# Patient Record
Sex: Male | Born: 1995 | Hispanic: Yes | Marital: Single | Smoking: Current every day smoker
Health system: Southern US, Community
[De-identification: ages and names within clinical notes are randomized; demographics above are authoritative.]

## PROBLEM LIST (undated history)

## (undated) HISTORY — PX: MANDIBLE FRACTURE SURGERY: SHX706

---
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
## 2014-03-07 ENCOUNTER — Encounter (HOSPITAL_COMMUNITY): Payer: Self-pay | Admitting: Emergency Medicine

## 2014-03-07 ENCOUNTER — Emergency Department (HOSPITAL_COMMUNITY)
Admission: EM | Admit: 2014-03-07 | Discharge: 2014-03-07 | Disposition: A | Discharge status: Home / Self Care | Payer: Self-pay | Attending: Emergency Medicine | Admitting: Emergency Medicine

## 2014-03-07 DIAGNOSIS — F17200 Nicotine dependence, unspecified, uncomplicated: Secondary | ICD-10-CM | POA: Insufficient documentation

## 2014-03-07 DIAGNOSIS — S161XXA Strain of muscle, fascia and tendon at neck level, initial encounter: Secondary | ICD-10-CM

## 2014-03-07 DIAGNOSIS — Y939 Activity, unspecified: Secondary | ICD-10-CM | POA: Insufficient documentation

## 2014-03-07 DIAGNOSIS — S139XXA Sprain of joints and ligaments of unspecified parts of neck, initial encounter: Secondary | ICD-10-CM | POA: Insufficient documentation

## 2014-03-07 MED ORDER — METHOCARBAMOL 500 MG PO TABS
500.0000 mg | ORAL_TABLET | Freq: Once | ORAL | Status: AC
  Administered 2014-03-07: 500 mg via ORAL
  Filled 2014-03-07: qty 1

## 2014-03-07 MED ORDER — METHOCARBAMOL 750 MG PO TABS: 750.0000 mg | ORAL_TABLET | Freq: Four times a day (QID) | ORAL | Status: AC | PRN

## 2014-03-07 MED ORDER — NAPROXEN 500 MG PO TABS: 500.0000 mg | ORAL_TABLET | Freq: Two times a day (BID) | ORAL | Status: DC | PRN

## 2014-03-07 MED ORDER — IBUPROFEN 400 MG PO TABS
800.0000 mg | ORAL_TABLET | Freq: Once | ORAL | Status: AC
  Administered 2014-03-07: 800 mg via ORAL
  Filled 2014-03-07: qty 2
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
## 2014-03-07 NOTE — Discharge Instructions (Signed)
1. Medications: robaxin, naproxyn, usual home medications 2. Treatment: rest, drink plenty of fluids, gentle stretching as discussed, alternate ice and heat 3. Follow Up: Please followup with your primary doctor for discussion of your diagnoses and further evaluation after today's visit; if you do not have a primary care doctor use the resource guide provided to find one;   Cervical Sprain A cervical sprain is an injury in the neck in which the strong, fibrous tissues (ligaments) that connect your neck bones stretch or tear. Cervical sprains can range from mild to severe. Severe cervical sprains can cause the neck vertebrae to be unstable. This can lead to damage of the spinal cord and can result in serious nervous system problems. The amount of time it takes for a cervical sprain to get better depends on the cause and extent of the injury. Most cervical sprains heal in 1 to 3 weeks. CAUSES  Severe cervical sprains may be caused by:   Contact sport injuries (such as from football, rugby, wrestling, hockey, auto racing, gymnastics, diving, martial arts, or boxing).   Motor vehicle collisions.   Whiplash injuries. This is an injury from a sudden forward-and backward whipping movement of the head and neck.  Falls.  Mild cervical sprains may be caused by:   Being in an awkward position, such as while cradling a telephone between your ear and shoulder.   Sitting in a chair that does not offer proper support.   Working at a poorly Marketing executivedesigned computer station.   Looking up or down for long periods of time.  SYMPTOMS   Pain, soreness, stiffness, or a burning sensation in the front, back, or sides of the neck. This discomfort may develop immediately after the injury or slowly, 24 hours or more after the injury.   Pain or tenderness directly in the middle of the back of the neck.   Shoulder or upper back pain.   Limited ability to move the neck.   Headache.   Dizziness.    Weakness, numbness, or tingling in the hands or arms.   Muscle spasms.   Difficulty swallowing or chewing.   Tenderness and swelling of the neck.  DIAGNOSIS  Most of the time your health care provider can diagnose a cervical sprain by taking your history and doing a physical exam. Your health care provider will ask about previous neck injuries and any known neck problems, such as arthritis in the neck. X-rays may be taken to find out if there are any other problems, such as with the bones of the neck. Other tests, such as a CT scan or MRI, may also be needed.  TREATMENT  Treatment depends on the severity of the cervical sprain. Mild sprains can be treated with rest, keeping the neck in place (immobilization), and pain medicines. Severe cervical sprains are immediately immobilized. Further treatment is done to help with pain, muscle spasms, and other symptoms and may include:  Medicines, such as pain relievers, numbing medicines, or muscle relaxants.   Physical therapy. This may involve stretching exercises, strengthening exercises, and posture training. Exercises and improved posture can help stabilize the neck, strengthen muscles, and help stop symptoms from returning.  HOME CARE INSTRUCTIONS   Put ice on the injured area.   Put ice in a plastic bag.   Place a towel between your skin and the bag.   Leave the ice on for 15 20 minutes, 3 4 times a day.   If your injury was severe, you may have been  given a cervical collar to wear. A cervical collar is a two-piece collar designed to keep your neck from moving while it heals.  Do not remove the collar unless instructed by your health care provider.  If you have long hair, keep it outside of the collar.  Ask your health care provider before making any adjustments to your collar. Minor adjustments may be required over time to improve comfort and reduce pressure on your chin or on the back of your head.  Ifyou are allowed  to remove the collar for cleaning or bathing, follow your health care provider's instructions on how to do so safely.  Keep your collar clean by wiping it with mild soap and water and drying it completely. If the collar you have been given includes removable pads, remove them every 1 2 days and hand wash them with soap and water. Allow them to air dry. They should be completely dry before you wear them in the collar.  If you are allowed to remove the collar for cleaning and bathing, wash and dry the skin of your neck. Check your skin for irritation or sores. If you see any, tell your health care provider.  Do not drive while wearing the collar.   Only take over-the-counter or prescription medicines for pain, discomfort, or fever as directed by your health care provider.   Keep all follow-up appointments as directed by your health care provider.   Keep all physical therapy appointments as directed by your health care provider.   Make any needed adjustments to your workstation to promote good posture.   Avoid positions and activities that make your symptoms worse.   Warm up and stretch before being active to help prevent problems.  SEEK MEDICAL CARE IF:   Your pain is not controlled with medicine.   You are unable to decrease your pain medicine over time as planned.   Your activity level is not improving as expected.  SEEK IMMEDIATE MEDICAL CARE IF:   You develop any bleeding.  You develop stomach upset.  You have signs of an allergic reaction to your medicine.   Your symptoms get worse.   You develop new, unexplained symptoms.   You have numbness, tingling, weakness, or paralysis in any part of your body.  MAKE SURE YOU:   Understand these instructions.  Will watch your condition.  Will get help right away if you are not doing well or get worse. Document Released: 09/23/2007 Document Revised: 09/16/2013 Document Reviewed: 06/03/2013 [facility] Patient  Information 2014 [place], [place].     Emergency Department Resource Guide 1) Find a Doctor and Pay Out of Pocket Although you won't have to find out who is covered by your insurance plan, it is a good idea to ask around and get recommendations. You will then need to call the office and see if the doctor you have chosen will accept you as a new patient and what types of options they offer for patients who are self-pay. Some doctors offer discounts or will set up payment plans for their patients who do not have insurance, but you will need to ask so you aren't surprised when you get to your appointment.  2) Contact Your Local Health Department Not all health departments have doctors that can see patients for sick visits, but many do, so it is worth a call to see if yours does. If you don't know where your local health department is, you can check in your phone book. The CDC also  has a tool to help you locate your state's health department, and many state websites also have listings of all of their local health departments.  3) Find a Walk-in Clinic If your illness is not likely to be very severe or complicated, you may want to try a walk in clinic. These are popping up all over the country in pharmacies, drugstores, and shopping centers. They're usually staffed by nurse practitioners or physician assistants that have been trained to treat common illnesses and complaints. They're usually fairly quick and inexpensive. However, if you have serious medical issues or chronic medical problems, these are probably not your best option.  No Primary Care Doctor: - Call Health Connect at  [phone] - they can help you locate a primary care doctor that  accepts your insurance, provides certain services, etc. - Physician Referral Service- [phone]  Chronic Pain Problems: Organization         Address  Phone   Notes  [facility]  [phone] Patients need to be referred by their primary  care doctor.   Medication Assistance: Organization         Address  Phone   Notes  [facility] Medication [facility] [address] [place] [place], [place] [address] [phone] --Must be a resident of [facility] -- Must have NO insurance coverage whatsoever (no Medicaid/ Medicare, etc.) -- The pt. MUST have a primary care doctor that directs their care regularly and follows them in the community   [facility]ist  [phone]   [employer]  [phone]    Agencies that provide inexpensive medical care: Organization         Address  Phone   Notes  [facility]  [phone]   [facility]    [phone]   [facility] [address] [place], [place] [address] [phone]   [facility] [address] [place] [place] [phone]   Planned Parenthood    [phone]   [facility]    [phone]   [facility]  [address], [place] Phone:  [phone], Fax:  [phone] Hours of Operation:  9 am - 6 pm, M-F.  Also accepts Medicaid/Medicare and self-pay.  [facility]  [address], [place] Phone: [phone], Fax: [phone]. Hours of Operation:  8:30 am - 5:30 pm, M-F.  Also accepts Medicaid and self-pay.  [facility] [place] [address] [place] Phone: [phone]   [facility] [address] Natasha Bence [place], [place] [phone], Ext. [phone] Mondays & Thursdays: 7-9 AM.  First 15 patients are seen on a first come, first serve basis.    Medicaid-accepting [facility] Providers:  Organization         Address  Phone   Notes  [facility] [address],  [phone] Also accepts self-pay patients.  [facility] [address] Laurell Josephs [place], [place]  [phone]   [facility] [address], [place] [phone]   [facility] [address] [place] [phone]   Renaye Rakers [address], [place]   [phone] Only accepts [place] Access [place] patients after they have their name applied to their card.   Self-Pay (no insurance) in  [facility]:  Organization         Address  Phone   Notes  Sickle Cell Patients, [facility] [address] [place], [place] [phone]   [facility] [address] [place], [place] [phone]   [facility] [place]  [address], [place] [phone]   [facility]/Dr. Osei-Bonsu  [address] [place] or [address], [place] [phone] Phone number for both [place] and [place] locations is the same.  [facility] and [facility] [address] [place] [phone]   [facility] [address], [place] or [address] [phone]   [facility] [address], [place] [phone], phone; [phone], fax Sees patients 1st and 3rd Saturday of every month.  Must not qualify for public or private insurance (i.e. Medicaid, Medicare, [place] Health Choice, Veterans' Benefits)  Household income should be no more than 200% of the poverty level The clinic cannot treat you if you are pregnant or think you are pregnant  Sexually transmitted diseases are not treated at the clinic.   Dental Care: Organization         Address  Phone  Notes  [facility] Department of [facility] [address] [place], [place] [phone] Accepts children up to age 48 who are enrolled in [place] or [place] Health Choice; pregnant women with a Medicaid card; and children who have applied for Medicaid or [place] Health Choice, but were declined, whose parents can pay a reduced fee at time  of service.  [facility]  [address], [place] [phone] Accepts children up to age 76 who are enrolled in [place] or [place] Health Choice; pregnant women with a Medicaid card; and children who have applied for Medicaid or [place] Health Choice, but were declined, whose parents can pay a reduced fee at time of service.  [facility]  [address] [place], [place] [phone] Patients are seen by appointment only. Walk-ins are not accepted. [facility] will see patients 37 years of age and older. Monday - Tuesday (8am-5pm) Most Wednesdays (8:30-5pm) $30 per visit, cash only  [facility]  [address], [facility] [phone] Patients are seen by appointment only. Walk-ins are not accepted. [facility] will see patients 23 years of age and older. One Wednesday Evening (Monthly: Volunteer Based).  $30 per visit, cash only  [employer]  [phone] for adults; Children under age 7, call Graduate Pediatric Dentistry at [phone]. Children aged 67-14, please call [phone] to request a pediatric application.  Dental services are provided in all areas of dental care including fillings, crowns and bridges, complete and partial dentures, implants, gum treatment, root canals, and extractions. Preventive care is also provided. Treatment is provided to both adults and children. Patients are selected via a lottery and there is often a waiting list.   [facility] [address] [place]  [phone] [email or web address]   [facility] [address] [place], [place] [phone], Ext. [phone] Second and Fourth Thursday of each month, opens at 6:30 AM; Clinic ends at 9 AM.  Patients are seen on a first-come first-served basis, and a limited number are seen during each clinic.  [facility]  [address] Ether Griffins  [place], [place] [phone]   Eligibility Requirements You must have lived in [place], [place], or [place] counties for at least the last three months.   You cannot be eligible for state or federal sponsored [place], including [employer], [place], or [employer].   You generally cannot be eligible for healthcare insurance through your employer.    How to apply: Eligibility screenings are held every Tuesday and Wednesday afternoon from 1:00 pm until 4:00 pm. You do not need an appointment for the interview!  [facility] [address] [place], [place] [phone]   [facility] Health Department  [phone]   [facility] Health Department  [phone]   [facility] Health Department  [phone]    Behavioral Health Resources in the Community: Intensive Outpatient Programs Organization         Address  Phone  Notes  [facility] [address], [place], [place] [phone]   [facility] Outpatient [address] [place], [place] [phone]   [facility] [address] [place], [place]  [phone]   [facility] Mental Health [address]  [place], [place] [phone] or [phone]   Substance Abuse Resources Organization         Address  Phone  Notes  [facility] Drug Services  [phone]   [facility]  [phone]   [facility] [place]  [phone]   Floydene Flock  [phone]   Residential & Outpatient Substance Abuse Program  [phone]   Psychological Services Organization         Address  Phone  Notes  [facility]al Health  [phone]   [facility]  [phone]   [facility] Mental Health [address], [place] [phone] or [phone]    Mobile Crisis Teams Organization         Address  Phone  Notes  [facility], Mobile Crisis Care Unit  [phone]   [facility]  [facility]  [address] [place], [place] [phone]   Doristine Locks [address] [place] [phone]    Self-Help/Support Groups Organization         Address  Phone             Notes  [facility] [place] - variety of support groups  336- I7437963 Call for more information  Narcotics Anonymous (NA), [facility] [address], [employer] [place]  2 meetings at this location   Statistician         Address  Phone  Notes  [facility] [address],    [place]  [phone]   [facility]  [address] [place] [address], [place], [place] [phone]   [facility] Facility [address] [place], [place] [phone] Admissions: 8am-3pm M-F  [facility] [address],    [place], [place] [phone]   [facility] [address] Starling Manns [place], [place] [phone]   [facility] [address]  [place], [place] [phone]   [facility] - Intensive Outpatient [address]ure[address] Joseph[address], [place], [place] [phone]   [facility] [address] [place]  [place], [place] [phone] or [phone]   [facility] ([facility]) [address] [place], [place] [phone] Accepts Medicaid  [facility] [place] [address],  [place] [phone] Substance Abuse/Addiction  Treatment   [facility] Organization         Address  Phone  Notes  [facility]  [phone]   Angie Fava, PhD [address] Ervin Knack [place], [place]   [phone] or [phone]   [facility]   [address] [place], [place] [phone]   [facility] [address] [place], [place] [phone] Insurance/Medicaid/sponsorship through [facility] and Families [address]                                    [place], [place] [phone] Therapy/tele-psych/case  [facility] [address], [place] [phone]    Dr. Lolly Mustache  [phone]   [facility]  [employer] [facility] Dept. 1) [address] [place] 2) [address] [place] 3)  [address], [place] [phone]  [phone]   [facility] Child Abuse Hotline 9301928731 or [phone] (After Hours)

## 2014-03-07 NOTE — ED Provider Notes (Signed)
Medical screening examination/treatment/procedure(s) were performed by non-physician practitioner and as supervising physician I was immediately available for consultation/collaboration.     Kyo Cocuzza, MD 03/07/14 2330 
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
## 2016-06-20 ENCOUNTER — Emergency Department (HOSPITAL_COMMUNITY)
Admission: EM | Admit: 2016-06-20 | Discharge: 2016-06-20 | Disposition: A | Discharge status: Home / Self Care | Payer: No Typology Code available for payment source | Attending: Emergency Medicine | Admitting: Emergency Medicine

## 2016-06-20 DIAGNOSIS — J029 Acute pharyngitis, unspecified: Secondary | ICD-10-CM | POA: Insufficient documentation

## 2016-06-20 DIAGNOSIS — F17210 Nicotine dependence, cigarettes, uncomplicated: Secondary | ICD-10-CM | POA: Insufficient documentation

## 2016-06-20 DIAGNOSIS — R590 Localized enlarged lymph nodes: Secondary | ICD-10-CM

## 2016-06-20 LAB — RAPID STREP SCREEN (MED CTR MEBANE ONLY): STREPTOCOCCUS, GROUP A SCREEN (DIRECT): NEGATIVE

## 2016-06-20 NOTE — ED Provider Notes (Signed)
CSN: [id number]     Arrival date & time 06/20/16  0941 History  By signing my name below, I, [facility]arrissa Washington, attest that this documentation has been prepared under the direction and in the presence of Roxy Horsemanobert Julyssa Kyer, PA-C. Electronically Signed: Randell PatientMarrissa Washington, ED Scribe. 06/20/2016. 10:26 AM.    Chief Complaint  Patient presents with  . lymph node swelling     The history is provided by the patient. No language interpreter was used.   HPI Comments: Christopher Mccall is a 20 y.o. male with no pertinent chronic conditions who presents to the Emergency Department complaining of constant, mild righ-sided sore throat onset 1 day ago. Pt states that his right tonsil became swollen yesterday followed shortly by sore throat, subjective fever, and fatigue. Denies cough.  No past medical history on file. No past surgical history on file. No family history on file. Social History  Substance Use Topics  . Smoking status: Current Every Day Smoker    Types: Cigarettes  . Smokeless tobacco: Not on file  . Alcohol Use: No    Review of Systems  Constitutional: Positive for fever (subjective) and fatigue.  HENT: Positive for sore throat.   Respiratory: Negative for cough.       Allergies  Review of patient's allergies indicates no known allergies.  Home Medications   Prior to Admission medications   Medication Sig Start Date End Date Taking? Authorizing Provider  methocarbamol (ROBAXIN) 750 MG tablet Take 1 tablet (750 mg total) by mouth 4 (four) times daily as needed for muscle spasms (Take 1 tablet every 6 hours as needed for muscle spasms.). 03/07/14   Hannah Muthersbaugh, PA-C  naproxen (NAPROSYN) 500 MG tablet Take 1 tablet (500 mg total) by mouth 2 (two) times daily as needed. 03/07/14   Hannah Muthersbaugh, PA-C   BP 125/65 mmHg  Pulse 88  Temp(Src) 100.5 F (38.1 C) (Oral)  Resp 18  SpO2 99% Physical Exam  Constitutional: He is oriented to person, place, and time. He  appears well-developed and well-nourished. No distress.  HENT:  Head: Normocephalic and atraumatic.  Oropharynx is moderately erythematous, no tonsillar exudates, no evidence. Tonsillar or tonsillar abscess, uvula is midline, airway intact, no stridor  Eyes: Conjunctivae are normal.  Neck: Normal range of motion.  Positive cervical adenopathy  Cardiovascular: Normal rate.   Pulmonary/Chest: Effort normal. No respiratory distress.  Musculoskeletal: Normal range of motion.  Lymphadenopathy:    He has cervical adenopathy.  Neurological: He is alert and oriented to person, place, and time.  Skin: Skin is warm and dry.  Psychiatric: He has a normal mood and affect. His behavior is normal.  Nursing note and vitals reviewed.   ED Course  Procedures  DIAGNOSTIC STUDIES: Oxygen Saturation is 99% on RA, normal by my interpretation.    COORDINATION OF CARE: 10:07 AM Will order strep test. Discussed treatment plan with pt at bedside and pt agreed to plan.   Labs Review Labs Reviewed  RAPID STREP SCREEN ([facility] AT [facility])   I have personally reviewed and evaluated these lab results as part of my medical decision-making.    MDM   Final diagnoses:  Sore throat  Lymphadenopathy of right cervical region    Patient with sore throat with associated cervical lymphadenopathy. Will check rapid strep test.  10:50 AM Informed that patient eloped prior to receiving results from rapid strep test.  I personally performed the services described in this documentation, which was scribed in my presence. The recorded information has  been reviewed and is accurate.      Roxy Horseman, PA-C 06/20/16 1051  Gerhard Munch, MD 06/21/16 [phone]

## 2016-06-20 NOTE — ED Notes (Signed)
PT walked out with out waiting for results from  Rapid strep test..

## 2016-06-20 NOTE — ED Notes (Addendum)
Patient here with right sided neck discomfort and lymph node swelling x 1 day, states that he feels fatigued with same, NAD, redness to throat noted

## 2016-06-22 LAB — CULTURE, GROUP A STREP (THRC)

## 2016-10-07 ENCOUNTER — Encounter (HOSPITAL_COMMUNITY): Payer: Self-pay | Admitting: *Deleted

## 2016-10-07 ENCOUNTER — Ambulatory Visit (INDEPENDENT_AMBULATORY_CARE_PROVIDER_SITE_OTHER): Payer: Self-pay

## 2016-10-07 ENCOUNTER — Ambulatory Visit (HOSPITAL_COMMUNITY)
Admission: EM | Admit: 2016-10-07 | Discharge: 2016-10-07 | Disposition: A | Discharge status: Home / Self Care | Payer: Self-pay | Attending: Internal Medicine | Admitting: Internal Medicine

## 2016-10-07 DIAGNOSIS — M25462 Effusion, left knee: Secondary | ICD-10-CM

## 2016-10-07 MED ORDER — IBUPROFEN 800 MG PO TABS
ORAL_TABLET | ORAL | Status: AC
  Filled 2016-10-07: qty 1

## 2016-10-07 MED ORDER — LIDOCAINE HCL 2 % IJ SOLN
INTRAMUSCULAR | Status: AC
  Filled 2016-10-07: qty 20

## 2016-10-07 MED ORDER — OXYCODONE-ACETAMINOPHEN 10-325 MG PO TABS: 1.0000 | ORAL_TABLET | ORAL | 0 refills | Status: DC | PRN

## 2016-10-07 MED ORDER — IBUPROFEN 800 MG PO TABS
800.0000 mg | ORAL_TABLET | Freq: Once | ORAL | Status: AC
  Administered 2016-10-07: 800 mg via ORAL

## 2016-10-07 NOTE — ED Triage Notes (Signed)
Pt  Reported  Last  Pm  He  Twisted  His  l  Leg  And  inj  The  Knee  He  Has  Pain on  rom  As   Well  As   Weight  Bearing      He  Ambulated  To the  Exam room

## 2016-10-07 NOTE — ED Provider Notes (Signed)
CSN: [id number]     Arrival date & time 10/07/16  1628 History   First MD Initiated Contact with Patient 10/07/16 1734     Chief Complaint  Patient presents with  . Knee Injury   (Consider location/radiation/quality/duration/timing/severity/associated sxs/prior Treatment) HPI NP 20Y/O MALE RUNNING ON WET GRASS SLIPPED AND TWISTED HIS LEFT KNEE,SWOLLEN, PAINFUL TO WALK. NO HOME TREATMENT.  History reviewed. No pertinent past medical history. History reviewed. No pertinent surgical history. History reviewed. No pertinent family history. Social History  Substance Use Topics  . Smoking status: Current Every Day Smoker    Types: Cigarettes  . Smokeless tobacco: Not on file  . Alcohol use No    Review of Systems  Denies: HEADACHE, NAUSEA, ABDOMINAL PAIN, CHEST PAIN, CONGESTION, DYSURIA, SHORTNESS OF BREATH  Allergies  Review of patient's allergies indicates no known allergies.  Home Medications   Prior to Admission medications   Medication Sig Start Date End Date Taking? Authorizing Provider  methocarbamol (ROBAXIN) 750 MG tablet Take 1 tablet (750 mg total) by mouth 4 (four) times daily as needed for muscle spasms (Take 1 tablet every 6 hours as needed for muscle spasms.). 03/07/14   Hannah Muthersbaugh, PA-C  naproxen (NAPROSYN) 500 MG tablet Take 1 tablet (500 mg total) by mouth 2 (two) times daily as needed. 03/07/14   Hannah Muthersbaugh, PA-C   Meds Ordered and Administered this Visit  Medications - No data to display  BP 124/70 (BP Location: Right Arm)   Pulse 80   Temp 98.6 F (37 C) (Oral)   Resp 16   SpO2 100%  No data found.   Physical Exam NURSES NOTES AND VITAL SIGNS REVIEWED. CONSTITUTIONAL: Well developed, well nourished, no acute distress HEENT: normocephalic, atraumatic EYES: Conjunctiva normal NECK:normal ROM, supple, no adenopathy PULMONARY:No respiratory distress, normal effort ABDOMINAL: Soft, ND, NT BS+, No CVAT MUSCULOSKELETAL: Normal ROM of all  extremities, LEFT KNEE, LARGE EFFUSION UNABLE TO DO GOOD EXAM DUE TO PAIN AND SWELLING.  SKIN: warm and dry without rash PSYCHIATRIC: Mood and affect, behavior are normal  Urgent Care Course   Clinical Course    .Joint Aspiration/Arthrocentesis Date/Time: 10/07/2016 9:14 PM Performed by: Tharon AquasPATRICK, Klever Twyford C Authorized by: Eustace MooreMURRAY, LAURA W   Consent:    Consent obtained:  Verbal   Consent given by:  Patient   Risks discussed:  Bleeding and infection Location:    Location:  Knee   Knee:  L knee Anesthesia (see MAR for exact dosages):    Anesthesia method:  Local infiltration   Local anesthetic:  Lidocaine 1% WITH epi and bupivacaine 0.25% w/o epi Procedure details:    Preparation: Patient was prepped and draped in usual sterile fashion     Needle gauge:  18 G   Ultrasound guidance: no     Approach:  Medial   Aspirate amount:  45 ML   Aspirate characteristics:  Bloody   Steroid injected: no     Specimen collected: no   Post-procedure details:    Dressing:  Adhesive bandage   Patient tolerance of procedure:  Tolerated well, no immediate complications   (including critical care time)  Labs Review Labs Reviewed - No data to display  Imaging Review Dg Knee Complete 4 Views Left  Result Date: 10/07/2016 CLINICAL DATA:  Patient slipped and fell yesterday.  Pain. EXAM: LEFT KNEE - COMPLETE 4+ VIEW COMPARISON:  05/09/2011. FINDINGS: No evidence of fracture, dislocation, or joint effusion. No evidence of arthropathy or other focal bone abnormality. Tibial tubercle normal  variant redemonstrated. There is a large joint effusion without hemarthrosis. IMPRESSION: Large joint effusion without visible fracture or dislocation. Electronically Signed   By: Elsie StainJohn T Curnes M.D.   On: 10/07/2016 17:52     Visual Acuity Review  Right Eye Distance:   Left Eye Distance:   Bilateral Distance:    Right Eye Near:   Left Eye Near:    Bilateral Near:         MDM   1. Effusion of left  knee     Patient is reassured that there are no issues that require transfer to higher level of care at this time or additional tests. Patient is advised to continue home symptomatic treatment. Patient is advised that if there are new or worsening symptoms to attend the emergency department, contact primary care provider, or return to UC. Instructions of care provided discharged home in stable condition.    THIS NOTE WAS GENERATED USING A VOICE RECOGNITION SOFTWARE PROGRAM. ALL REASONABLE EFFORTS  WERE MADE TO PROOFREAD THIS DOCUMENT FOR ACCURACY.  I have verbally reviewed the discharge instructions with the patient. A printed AVS was given to the patient.  All questions were answered prior to discharge.      Tharon AquasFrank C Lesly Joslyn, PA 10/07/16 2116

## 2016-10-18 ENCOUNTER — Encounter (INDEPENDENT_AMBULATORY_CARE_PROVIDER_SITE_OTHER): Payer: Self-pay | Admitting: Orthopedic Surgery

## 2016-10-18 ENCOUNTER — Ambulatory Visit (INDEPENDENT_AMBULATORY_CARE_PROVIDER_SITE_OTHER): Payer: Self-pay | Admitting: Orthopedic Surgery

## 2016-10-18 DIAGNOSIS — M25562 Pain in left knee: Secondary | ICD-10-CM

## 2016-10-18 DIAGNOSIS — M25462 Effusion, left knee: Secondary | ICD-10-CM

## 2016-10-18 NOTE — Progress Notes (Signed)
Office Visit Note   Patient: Christopher Mccall           Date of Birth: [birth year]           MRN: [id number] Visit Date: 10/18/2016 Requested by: No referring provider defined for this encounter. PCP: Default, Provider, MD  Subjective: Chief Complaint  Patient presents with  . Left Knee - Pain, Injury    HPI [name] is a 20 year old patient injured his left knee 10/15/2016.  Seen in urgent care 10/16/2016 outside radiographs negative for fracture.  He was running on the wet grass slipped and twisted his left leg and wearing a knee immobilizer, he has been walking since that time.  he does describe swelling in the knee but no instability.  He reports primarily lateral sided knee pain it's less overall now.  He works as a Dietitianboat detailer.              Review of Systems All systems reviewed are negative as they relate to the chief complaint within the history of present illness.  Patient denies  fevers or chills.    Assessment & Plan: Visit Diagnoses:  1. Acute pain of left knee   2. Swelling of left knee joint     Plan: Impression is moderate left knee effusion following twisting injury with normal ligaments on exam.  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  He needs an MRI scan to rule out meniscal tear.  He does have lateral and medial joint line tenderness.  Mechanism of injury is consistent with either meniscal damage or chondral damage of the knee.  There is also a chance this could represent patellar instability  Follow-Up Instructions: Return for after MRI.   Orders:  Orders Placed This Encounter  Procedures  . MR Knee Left w/o contrast   No orders of the defined types were placed in this encounter.     Procedures: No procedures performed   Clinical Data: No additional findings.  Objective: Vital Signs: There were no vitals taken for this visit.  Physical Exam  Constitutional: He appears well-developed.  HENT:  Head: Normocephalic.  Eyes: EOM  are normal.  Neck: Normal range of motion.  Cardiovascular: Normal rate.   Pulmonary/Chest: Effort normal.  Neurological: He is alert.  Skin: Skin is warm.  Psychiatric: He has a normal mood and affect.    Ortho Exam examination the left knee demonstrates moderate effusion no groin pain with internal/external rotation leg palpable pedal pulses he has flexion easily past 90 and full extension collaterals are stable at 0 and 30 to varus and valgus stress.  PCL intact no posterior lateral rotatory instability is noted.  He does have medial and lateral joint line tenderness.  Not much in the way of patellar apprehension but he does have a lot of patellar mobility medially and laterally in both knees.  Specialty Comments:  No specialty comments available.  Imaging: No results found.   PMFS History: There are no active problems to display for this patient.  No past medical history on file.  No family history on file.  No past surgical history on file. Social History   Occupational History  . Not on file.   Social History Main Topics  . Smoking status: Current Every Day Smoker    Types: Cigarettes  . Smokeless tobacco: Not on file  . Alcohol use No  . Drug use: No  . Sexual activity: Not on file

## 2016-11-03 ENCOUNTER — Other Ambulatory Visit: Payer: Self-pay

## 2016-11-08 ENCOUNTER — Ambulatory Visit (INDEPENDENT_AMBULATORY_CARE_PROVIDER_SITE_OTHER): Payer: Self-pay | Admitting: Orthopedic Surgery

## 2017-02-01 ENCOUNTER — Ambulatory Visit (INDEPENDENT_AMBULATORY_CARE_PROVIDER_SITE_OTHER): Payer: Self-pay | Admitting: Orthopedic Surgery

## 2018-06-18 ENCOUNTER — Encounter (HOSPITAL_COMMUNITY): Payer: Self-pay | Admitting: Emergency Medicine

## 2018-06-18 ENCOUNTER — Other Ambulatory Visit: Payer: Self-pay

## 2018-06-18 ENCOUNTER — Emergency Department (HOSPITAL_COMMUNITY): Payer: Self-pay

## 2018-06-18 ENCOUNTER — Emergency Department (HOSPITAL_COMMUNITY)
Admission: EM | Admit: 2018-06-18 | Discharge: 2018-06-18 | Disposition: A | Discharge status: Other Acute Inpatient Hospital | Payer: Self-pay | Attending: Emergency Medicine | Admitting: Emergency Medicine

## 2018-06-18 DIAGNOSIS — W228XXA Striking against or struck by other objects, initial encounter: Secondary | ICD-10-CM | POA: Insufficient documentation

## 2018-06-18 DIAGNOSIS — Y9301 Activity, walking, marching and hiking: Secondary | ICD-10-CM | POA: Insufficient documentation

## 2018-06-18 DIAGNOSIS — Y999 Unspecified external cause status: Secondary | ICD-10-CM | POA: Insufficient documentation

## 2018-06-18 DIAGNOSIS — F17210 Nicotine dependence, cigarettes, uncomplicated: Secondary | ICD-10-CM | POA: Insufficient documentation

## 2018-06-18 DIAGNOSIS — Y929 Unspecified place or not applicable: Secondary | ICD-10-CM | POA: Insufficient documentation

## 2018-06-18 DIAGNOSIS — S02609B Fracture of mandible, unspecified, initial encounter for open fracture: Secondary | ICD-10-CM | POA: Insufficient documentation

## 2018-06-18 LAB — I-STAT CHEM 8, ED
BUN: 7 mg/dL (ref 6–20)
CALCIUM ION: 1.15 mmol/L (ref 1.15–1.40)
Chloride: 99 mmol/L (ref 98–111)
Creatinine, Ser: 1.1 mg/dL (ref 0.61–1.24)
Glucose, Bld: 140 mg/dL — ABNORMAL HIGH (ref 70–99)
HEMATOCRIT: 43 % (ref 39.0–52.0)
Hemoglobin: 14.6 g/dL (ref 13.0–17.0)
Potassium: 3.6 mmol/L (ref 3.5–5.1)
SODIUM: 139 mmol/L (ref 135–145)
TCO2: 26 mmol/L (ref 22–32)

## 2018-06-18 LAB — CBC WITH DIFFERENTIAL/PLATELET
Abs Immature Granulocytes: 0.1 10*3/uL (ref 0.0–0.1)
BASOS ABS: 0.1 10*3/uL (ref 0.0–0.1)
BASOS PCT: 1 %
EOS PCT: 0 %
Eosinophils Absolute: 0 10*3/uL (ref 0.0–0.7)
HCT: 44.4 % (ref 39.0–52.0)
HEMOGLOBIN: 14.8 g/dL (ref 13.0–17.0)
Immature Granulocytes: 0 %
LYMPHS PCT: 6 %
Lymphs Abs: 1.2 10*3/uL (ref 0.7–4.0)
MCH: 30.6 pg (ref 26.0–34.0)
MCHC: 33.3 g/dL (ref 30.0–36.0)
MCV: 91.7 fL (ref 78.0–100.0)
MONO ABS: 0.5 10*3/uL (ref 0.1–1.0)
Monocytes Relative: 3 %
NEUTROS ABS: 16.3 10*3/uL — AB (ref 1.7–7.7)
Neutrophils Relative %: 90 %
PLATELETS: 279 10*3/uL (ref 150–400)
RBC: 4.84 MIL/uL (ref 4.22–5.81)
RDW: 10.9 % — ABNORMAL LOW (ref 11.5–15.5)
WBC: 18.2 10*3/uL — AB (ref 4.0–10.5)

## 2018-06-18 LAB — ETHANOL

## 2018-06-18 MED ORDER — FENTANYL CITRATE (PF) 100 MCG/2ML IJ SOLN
100.0000 ug | Freq: Once | INTRAMUSCULAR | Status: AC
  Administered 2018-06-18: 100 ug via INTRAVENOUS
  Filled 2018-06-18: qty 2

## 2018-06-18 NOTE — ED Triage Notes (Signed)
Pt reports he does not remember the fall . He fell face first on a concrete  Floor. Pt reports his friend thought he had a seizure. Pt reports he does not remember the fall.

## 2018-06-18 NOTE — ED Notes (Addendum)
Report given to Rodolph BongBrant, [facility]Charge [facility]

## 2018-06-18 NOTE — ED Provider Notes (Signed)
[facility] EMERGENCY DEPARTMENT Provider Note   CSN: [id number] Arrival date & time: 06/18/18  9562     History   Chief Complaint Chief Complaint  Patient presents with  . Seizures  . Mouth Injury    HPI Christopher Mccall is a 22 y.o. male.  HPI Patient presents after a fall.  Reportedly was walking down the hall and fell down.  Does not think he tripped over the fingers or know why he fell.  Complaining of pain in his right jaw.  States he has had 2 beers today.  Cannot open his jaw.  No other injury.  Does have a dull headache.  No seizure history.  No history of syncope previously.  No family history of early cardiac death.  Patient states he has not eaten this morning. History reviewed. No pertinent past medical history.  There are no active problems to display for this patient.   History reviewed. No pertinent surgical history.      Home Medications    Prior to Admission medications   Medication Sig Start Date End Date Taking? Authorizing Provider  aspirin 81 MG chewable tablet Chew 324 mg by mouth once.   Yes [provider]  methocarbamol (ROBAXIN) 750 MG tablet Take 1 tablet (750 mg total) by mouth 4 (four) times daily as needed for muscle spasms (Take 1 tablet every 6 hours as needed for muscle spasms.). Patient not taking: Reported on 10/18/2016 03/07/14   Muthersbaugh, Dahlia Client, PA-C    Family History No family history on file.  Social History Social History   Tobacco Use  . Smoking status: Current Every Day Smoker    Types: Cigarettes  . Smokeless tobacco: Current User  Substance Use Topics  . Alcohol use: No  . Drug use: No     Allergies   Patient has no known allergies.   Review of Systems Review of Systems  Constitutional: Negative for fever.  HENT:       Right jaw pain.  Respiratory: Negative for shortness of breath.   Gastrointestinal: Negative for abdominal pain.  Endocrine: Negative for polyuria.    Genitourinary: Negative for flank pain.  Musculoskeletal: Positive for back pain.  Skin: Negative for pallor.  Neurological: Positive for syncope and headaches.  Hematological: Negative for adenopathy.     Physical Exam Updated Vital Signs BP 121/76   Pulse 61   Temp 97.6 F (36.4 C) (Oral)   Resp 18   SpO2 99%   Physical Exam  Constitutional: He appears well-developed.  HENT:  Tenderness to right and anterior mandible.  Some blood on lips.  Patient cannot or will not open his jaw.  Will not really allow him to evaluate intraorally.  Eyes: Pupils are equal, round, and reactive to light.  Neck:  No cervical spine tenderness.  Painless range of motion.  Cardiovascular: Normal rate.  Pulmonary/Chest: Effort normal.  Abdominal: There is no tenderness.  Musculoskeletal: He exhibits no edema.  Neurological: He is alert.  Skin: Skin is warm. Capillary refill takes less than 2 seconds.     ED Treatments / Results  Labs (all labs ordered are listed, but only abnormal results are displayed) Labs Reviewed  CBC WITH DIFFERENTIAL/PLATELET - Abnormal; Notable for the following components:      Result Value   WBC 18.2 (*)    RDW 10.9 (*)    Neutro Abs 16.3 (*)    All other components within normal limits  I-STAT CHEM 8, ED - Abnormal;  Notable for the following components:   Glucose, Bld 140 (*)    All other components within normal limits  ETHANOL  RAPID URINE DRUG SCREEN, HOSP PERFORMED    EKG None  Radiology Ct Head Wo Contrast  Result Date: 06/18/2018 CLINICAL DATA:  22 year old male with possible seizure. Witnessed fall face 1st onto concrete. Pain, headache, lethargy. No prior seizure history. EXAM: CT HEAD WITHOUT CONTRAST CT MAXILLOFACIAL WITHOUT CONTRAST TECHNIQUE: Multidetector CT imaging of the head and maxillofacial structures were performed using the standard protocol without intravenous contrast. Multiplanar CT image reconstructions of the maxillofacial  structures were also generated. COMPARISON:  None. FINDINGS: CT HEAD FINDINGS Brain: Normal cerebral volume. No midline shift, ventriculomegaly, mass effect, evidence of mass lesion, intracranial hemorrhage or evidence of cortically based acute infarction. Gray-white matter differentiation is within normal limits throughout the brain. Vascular: No suspicious intracranial vascular hyperdensity. Skull: Mandible fractures, see below. No skull fracture identified. Other: No scalp hematoma identified. CT MAXILLOFACIAL FINDINGS Osseous: Bilateral subcondylar mandible fractures. That on the left is mildly angulated but otherwise nondisplaced. The fracture on the right is impacted and angulated with medial and anterior dislocation of the right mandible condyle from the temporal articular fossa. Small volume of gas in an around the dislocated mandible fragment is not associated with abnormal fluid or opacification in the right middle ear or mastoids and might be from disruption of the joint. Superimposed comminuted fracture of the mandible symphysis traversing across the right mandible canine and anterior bicuspid and involving the roots of both. The dentition appears to remain located. Elsewhere this comminuted fracture is minimally displaced. Small volume of gas surrounding the fracture site. Intact maxilla but carious anterior maxillary molars and the right anterior maxillary molar are appears fractured (series 10, image 43). Intact nasal bones, zygoma, pterygoid plates and central skull base. The visible cervical vertebrae are intact with straightening of lordosis. Orbits: Intact orbital walls. Normal bilateral orbits soft tissues. Sinuses: The paranasal sinuses are well pneumatized throughout. There is minimal right maxillary sinus mucosal thickening. There is rightward nasal septal deviation and spurring which may be chronic. Trace retained secretions in the nasal cavity. The bilateral tympanic cavities and mastoids  are clear. Soft tissues: Negative visible noncontrast larynx, pharynx, parapharyngeal spaces, retropharyngeal space. There is a small volume of gas in the right parotid and the right sublingual spaces associated with the right TMJ injury. The left parotid space is normal. No masticator space hematoma identified. Paucity of fat planes in the neck. Visible cervical lymph nodes appear within normal limits. IMPRESSION: CT HEAD Normal noncontrast CT appearance of the brain. No skull fracture identified. CT FACE 1. Bilateral subcondylar and symphysis mandible fractures. - impacted and angulated right subcondylar mandible fracture with associated right TMJ dislocation. - mildly angulated but otherwise nondisplaced left subcondylar fracture. - comminuted symphysis fracture with mildly displaced component tracking between the right mandible canine and anterior bicuspid and involving the roots of both. 2. Fracture of a carious right maxillary anterior molar. All dentition appears to remain located. 3. Posttraumatic gas in the sublingual and right parotid spaces. 4. No other facial fracture identified. Electronically Signed   By: Odessa Fleming M.D.   On: 06/18/2018 12:01   Ct Maxillofacial Wo Contrast  Result Date: 06/18/2018 CLINICAL DATA:  22 year old male with possible seizure. Witnessed fall face 1st onto concrete. Pain, headache, lethargy. No prior seizure history. EXAM: CT HEAD WITHOUT CONTRAST CT MAXILLOFACIAL WITHOUT CONTRAST TECHNIQUE: Multidetector CT imaging of the head and maxillofacial structures were  performed using the standard protocol without intravenous contrast. Multiplanar CT image reconstructions of the maxillofacial structures were also generated. COMPARISON:  None. FINDINGS: CT HEAD FINDINGS Brain: Normal cerebral volume. No midline shift, ventriculomegaly, mass effect, evidence of mass lesion, intracranial hemorrhage or evidence of cortically based acute infarction. Gray-white matter differentiation is  within normal limits throughout the brain. Vascular: No suspicious intracranial vascular hyperdensity. Skull: Mandible fractures, see below. No skull fracture identified. Other: No scalp hematoma identified. CT MAXILLOFACIAL FINDINGS Osseous: Bilateral subcondylar mandible fractures. That on the left is mildly angulated but otherwise nondisplaced. The fracture on the right is impacted and angulated with medial and anterior dislocation of the right mandible condyle from the temporal articular fossa. Small volume of gas in an around the dislocated mandible fragment is not associated with abnormal fluid or opacification in the right middle ear or mastoids and might be from disruption of the joint. Superimposed comminuted fracture of the mandible symphysis traversing across the right mandible canine and anterior bicuspid and involving the roots of both. The dentition appears to remain located. Elsewhere this comminuted fracture is minimally displaced. Small volume of gas surrounding the fracture site. Intact maxilla but carious anterior maxillary molars and the right anterior maxillary molar are appears fractured (series 10, image 43). Intact nasal bones, zygoma, pterygoid plates and central skull base. The visible cervical vertebrae are intact with straightening of lordosis. Orbits: Intact orbital walls. Normal bilateral orbits soft tissues. Sinuses: The paranasal sinuses are well pneumatized throughout. There is minimal right maxillary sinus mucosal thickening. There is rightward nasal septal deviation and spurring which may be chronic. Trace retained secretions in the nasal cavity. The bilateral tympanic cavities and mastoids are clear. Soft tissues: Negative visible noncontrast larynx, pharynx, parapharyngeal spaces, retropharyngeal space. There is a small volume of gas in the right parotid and the right sublingual spaces associated with the right TMJ injury. The left parotid space is normal. No masticator space  hematoma identified. Paucity of fat planes in the neck. Visible cervical lymph nodes appear within normal limits. IMPRESSION: CT HEAD Normal noncontrast CT appearance of the brain. No skull fracture identified. CT FACE 1. Bilateral subcondylar and symphysis mandible fractures. - impacted and angulated right subcondylar mandible fracture with associated right TMJ dislocation. - mildly angulated but otherwise nondisplaced left subcondylar fracture. - comminuted symphysis fracture with mildly displaced component tracking between the right mandible canine and anterior bicuspid and involving the roots of both. 2. Fracture of a carious right maxillary anterior molar. All dentition appears to remain located. 3. Posttraumatic gas in the sublingual and right parotid spaces. 4. No other facial fracture identified. Electronically Signed   By: Odessa Fleming M.D.   On: 06/18/2018 12:01    Procedures Procedures (including critical care time)  Medications Ordered in ED Medications  fentaNYL (SUBLIMAZE) injection 100 mcg (100 mcg Intravenous Given 06/18/18 1229)     Initial Impression / Assessment and Plan / ED Course  I have reviewed the triage vital signs and the nursing notes.  Pertinent labs & imaging results that were available during my care of the patient were reviewed by me and considered in my medical decision making (see chart for details).     Patient with fall possible syncope.  Larey Seat and apparently came back to pretty quickly but may have been with staggering.  Has broken his jaw in at least 3 places.  Seen in the ER by Dr. Suszanne Conners.  Believes patient benefit from transfer to [facility] for further definitive  care.  [facility] in [facility].  Will send patient withright CD of his imaging and will also attempt to get it into the system so they can look it up themselves.  Final Clinical Impressions(s) / ED Diagnoses   Final diagnoses:  Open  fracture of mandible, unspecified laterality, unspecified mandibular site, initial encounter [facility](H[facility])    ED Discharge Orders    None       Benjiman CorePickering, Brenisha Tsui, MD 06/18/18 1333

## 2018-06-18 NOTE — ED Notes (Signed)
[facility].

## 2018-06-18 NOTE — ED Triage Notes (Signed)
[  pt. Stated, I don't remember, I think I fell flat on my face and hit my mouth about 3 hours ago.  Pt is holding his mouth and you can hardly understand what he is saying.

## 2018-06-18 NOTE — ED Triage Notes (Signed)
Pt. Stated, I might have had a seizure, pt not given a detailed history

## 2018-06-18 NOTE — ED Notes (Signed)
Pt advised that urine is needed, pt unable to pee at this time.

## 2018-06-18 NOTE — Consult Note (Signed)
Reason for Consult: Complex mandibular fractures Referring Physician: Benjiman Core, MD  HPI:  Christopher Mccall is an 22 y.o. male who presents to the [facility] emergency room today for evaluation of his mouth injury. The patient was reportedly walking down the hall and fell down.  He does not think he tripped over anything. He does not know why he fell.  Complaining of pain in his right jaw.  States he has had 2 beers today.  Cannot open his jaw.  No other injury.  Does have a dull headache.  No seizure history.  No history of syncope previously.  No family history of early cardiac death.  Patient states he has not eaten this morning. His CT scan shows bilateral subcondylar and symphysis mandible fractures, with right TMJ dislocation.  History reviewed. No pertinent past medical history.  History reviewed. No pertinent surgical history.  No family history on file.  Social History:  reports that he has been smoking cigarettes.  He uses smokeless tobacco. He reports that he does not drink alcohol or use drugs.  Allergies: No Known Allergies  Prior to Admission medications   Medication Sig Start Date End Date Taking? Authorizing Provider  aspirin 81 MG chewable tablet Chew 324 mg by mouth once.   Yes [provider]  methocarbamol (ROBAXIN) 750 MG tablet Take 1 tablet (750 mg total) by mouth 4 (four) times daily as needed for muscle spasms (Take 1 tablet every 6 hours as needed for muscle spasms.). Patient not taking: Reported on 10/18/2016 03/07/14   Muthersbaugh, Dahlia Client, PA-C    Results for orders placed or performed during the hospital encounter of 06/18/18 (from the past 48 hour(s))  CBC with Differential     Status: Abnormal   Collection Time: 06/18/18 10:04 AM  Result Value Ref Range   WBC 18.2 (H) 4.0 - 10.5 K/uL   RBC 4.84 4.22 - 5.81 MIL/uL   Hemoglobin 14.8 13.0 - 17.0 g/dL   HCT 78.2 95.6 - 21.3 %   MCV 91.7 78.0 - 100.0 fL   MCH 30.6 26.0 - 34.0 pg   MCHC 33.3 30.0  - 36.0 g/dL   RDW 08.6 (L) 57.8 - 46.9 %   Platelets 279 150 - 400 K/uL   Neutrophils Relative % 90 %   Neutro Abs 16.3 (H) 1.7 - 7.7 K/uL   Lymphocytes Relative 6 %   Lymphs Abs 1.2 0.7 - 4.0 K/uL   Monocytes Relative 3 %   Monocytes Absolute 0.5 0.1 - 1.0 K/uL   Eosinophils Relative 0 %   Eosinophils Absolute 0.0 0.0 - 0.7 K/uL   Basophils Relative 1 %   Basophils Absolute 0.1 0.0 - 0.1 K/uL   Immature Granulocytes 0 %   Abs Immature Granulocytes 0.1 0.0 - 0.1 K/uL    Comment: Performed at [facility], [address] [place], [place] [address]  I-stat Chem 8, ED     Status: Abnormal   Collection Time: 06/18/18 11:02 AM  Result Value Ref Range   Sodium 139 135 - 145 mmol/L   Potassium 3.6 3.5 - 5.1 mmol/L   Chloride 99 98 - 111 mmol/L   BUN 7 6 - 20 mg/dL   Creatinine, Ser 8.41 0.61 - 1.24 mg/dL   Glucose, Bld 324 (H) 70 - 99 mg/dL   Calcium, Ion 4.01 0.27 - 1.40 mmol/L   TCO2 26 22 - 32 mmol/L   Hemoglobin 14.6 13.0 - 17.0 g/dL   HCT 25.3 66.4 -  52.0 %    Ct Head Wo Contrast  Result Date: 06/18/2018 CLINICAL DATA:  22 year old male with possible seizure. Witnessed fall face 1st onto concrete. Pain, headache, lethargy. No prior seizure history. EXAM: CT HEAD WITHOUT CONTRAST CT MAXILLOFACIAL WITHOUT CONTRAST TECHNIQUE: Multidetector CT imaging of the head and maxillofacial structures were performed using the standard protocol without intravenous contrast. Multiplanar CT image reconstructions of the maxillofacial structures were also generated. COMPARISON:  None. FINDINGS: CT HEAD FINDINGS Brain: Normal cerebral volume. No midline shift, ventriculomegaly, mass effect, evidence of mass lesion, intracranial hemorrhage or evidence of cortically based acute infarction. Gray-white matter differentiation is within normal limits throughout the brain. Vascular: No suspicious intracranial vascular hyperdensity. Skull: Mandible fractures, see below. No skull fracture identified. Other:  No scalp hematoma identified. CT MAXILLOFACIAL FINDINGS Osseous: Bilateral subcondylar mandible fractures. That on the left is mildly angulated but otherwise nondisplaced. The fracture on the right is impacted and angulated with medial and anterior dislocation of the right mandible condyle from the temporal articular fossa. Small volume of gas in an around the dislocated mandible fragment is not associated with abnormal fluid or opacification in the right middle ear or mastoids and might be from disruption of the joint. Superimposed comminuted fracture of the mandible symphysis traversing across the right mandible canine and anterior bicuspid and involving the roots of both. The dentition appears to remain located. Elsewhere this comminuted fracture is minimally displaced. Small volume of gas surrounding the fracture site. Intact maxilla but carious anterior maxillary molars and the right anterior maxillary molar are appears fractured (series 10, image 43). Intact nasal bones, zygoma, pterygoid plates and central skull base. The visible cervical vertebrae are intact with straightening of lordosis. Orbits: Intact orbital walls. Normal bilateral orbits soft tissues. Sinuses: The paranasal sinuses are well pneumatized throughout. There is minimal right maxillary sinus mucosal thickening. There is rightward nasal septal deviation and spurring which may be chronic. Trace retained secretions in the nasal cavity. The bilateral tympanic cavities and mastoids are clear. Soft tissues: Negative visible noncontrast larynx, pharynx, parapharyngeal spaces, retropharyngeal space. There is a small volume of gas in the right parotid and the right sublingual spaces associated with the right TMJ injury. The left parotid space is normal. No masticator space hematoma identified. Paucity of fat planes in the neck. Visible cervical lymph nodes appear within normal limits. IMPRESSION: CT HEAD Normal noncontrast CT appearance of the brain.  No skull fracture identified. CT FACE 1. Bilateral subcondylar and symphysis mandible fractures. - impacted and angulated right subcondylar mandible fracture with associated right TMJ dislocation. - mildly angulated but otherwise nondisplaced left subcondylar fracture. - comminuted symphysis fracture with mildly displaced component tracking between the right mandible canine and anterior bicuspid and involving the roots of both. 2. Fracture of a carious right maxillary anterior molar. All dentition appears to remain located. 3. Posttraumatic gas in the sublingual and right parotid spaces. 4. No other facial fracture identified. Electronically Signed   By: Odessa Fleming M.D.   On: 06/18/2018 12:01   Ct Maxillofacial Wo Contrast  Result Date: 06/18/2018 CLINICAL DATA:  22 year old male with possible seizure. Witnessed fall face 1st onto concrete. Pain, headache, lethargy. No prior seizure history. EXAM: CT HEAD WITHOUT CONTRAST CT MAXILLOFACIAL WITHOUT CONTRAST TECHNIQUE: Multidetector CT imaging of the head and maxillofacial structures were performed using the standard protocol without intravenous contrast. Multiplanar CT image reconstructions of the maxillofacial structures were also generated. COMPARISON:  None. FINDINGS: CT HEAD FINDINGS Brain: Normal cerebral volume. No  midline shift, ventriculomegaly, mass effect, evidence of mass lesion, intracranial hemorrhage or evidence of cortically based acute infarction. Gray-white matter differentiation is within normal limits throughout the brain. Vascular: No suspicious intracranial vascular hyperdensity. Skull: Mandible fractures, see below. No skull fracture identified. Other: No scalp hematoma identified. CT MAXILLOFACIAL FINDINGS Osseous: Bilateral subcondylar mandible fractures. That on the left is mildly angulated but otherwise nondisplaced. The fracture on the right is impacted and angulated with medial and anterior dislocation of the right mandible condyle from  the temporal articular fossa. Small volume of gas in an around the dislocated mandible fragment is not associated with abnormal fluid or opacification in the right middle ear or mastoids and might be from disruption of the joint. Superimposed comminuted fracture of the mandible symphysis traversing across the right mandible canine and anterior bicuspid and involving the roots of both. The dentition appears to remain located. Elsewhere this comminuted fracture is minimally displaced. Small volume of gas surrounding the fracture site. Intact maxilla but carious anterior maxillary molars and the right anterior maxillary molar are appears fractured (series 10, image 43). Intact nasal bones, zygoma, pterygoid plates and central skull base. The visible cervical vertebrae are intact with straightening of lordosis. Orbits: Intact orbital walls. Normal bilateral orbits soft tissues. Sinuses: The paranasal sinuses are well pneumatized throughout. There is minimal right maxillary sinus mucosal thickening. There is rightward nasal septal deviation and spurring which may be chronic. Trace retained secretions in the nasal cavity. The bilateral tympanic cavities and mastoids are clear. Soft tissues: Negative visible noncontrast larynx, pharynx, parapharyngeal spaces, retropharyngeal space. There is a small volume of gas in the right parotid and the right sublingual spaces associated with the right TMJ injury. The left parotid space is normal. No masticator space hematoma identified. Paucity of fat planes in the neck. Visible cervical lymph nodes appear within normal limits. IMPRESSION: CT HEAD Normal noncontrast CT appearance of the brain. No skull fracture identified. CT FACE 1. Bilateral subcondylar and symphysis mandible fractures. - impacted and angulated right subcondylar mandible fracture with associated right TMJ dislocation. - mildly angulated but otherwise nondisplaced left subcondylar fracture. - comminuted symphysis  fracture with mildly displaced component tracking between the right mandible canine and anterior bicuspid and involving the roots of both. 2. Fracture of a carious right maxillary anterior molar. All dentition appears to remain located. 3. Posttraumatic gas in the sublingual and right parotid spaces. 4. No other facial fracture identified. Electronically Signed   By: Odessa Fleming M.D.   On: 06/18/2018 12:01   Review of Systems  Constitutional: Negative for fever.  HENT: Right jaw pain.  Respiratory: Negative for shortness of breath.   Gastrointestinal: Negative for abdominal pain.  Endocrine: Negative for polyuria.  Genitourinary: Negative for flank pain.  Musculoskeletal: Positive for back pain.  Skin: Negative for pallor.  Neurological: Positive for syncope and headaches.  Hematological: Negative for adenopathy.   Blood pressure 119/79, pulse 93, temperature 97.6 F (36.4 C), temperature source Oral, resp. rate (!) 22, SpO2 99 %. Physical Exam  Constitutional: He appears well-developed, in mild distress. Eyes: Pupils are equal, round, and reactive to light. Ears: Normal auricles and external auditory canals. No bleeding or injuries. Nose: normal mucosa, septum, and turbinates. Mouth: Tenderness to right and anterior mandible.  Some blood on lips.  Patient cannot or will not open his jaw. Misalignment occlusion is noted. Neck: No cervical spine tenderness.  Painless range of motion. Trachea is midline. Cardiovascular: Normal rate.  Pulmonary/Chest: Effort normal.  Abdominal: There is no tenderness.  Musculoskeletal: He exhibits no edema.  Neurological: He is alert.  Skin: Skin is warm. Capillary refill takes less than 2 seconds.   Assessment/Plan: Complex bilateral mandibular fractures, with right TMJ dislocation. The severity of the fractures will likely require tertiary care transfer and treatment by an oral maxillofacial surgeon. The findings are reviewed with Dr. Rubin PayorPickering.  Helmi Hechavarria W  Natalie Mceuen 06/18/2018, 12:46 PM

## 2019-12-24 ENCOUNTER — Emergency Department (HOSPITAL_COMMUNITY)
Admission: EM | Admit: 2019-12-24 | Discharge: 2019-12-25 | Disposition: A | Discharge status: Home / Self Care | Payer: Self-pay | Attending: Emergency Medicine | Admitting: Emergency Medicine

## 2019-12-24 ENCOUNTER — Other Ambulatory Visit: Payer: Self-pay

## 2019-12-24 ENCOUNTER — Encounter (HOSPITAL_COMMUNITY): Payer: Self-pay | Admitting: Emergency Medicine

## 2019-12-24 DIAGNOSIS — R1010 Upper abdominal pain, unspecified: Secondary | ICD-10-CM | POA: Insufficient documentation

## 2019-12-24 DIAGNOSIS — Z5321 Procedure and treatment not carried out due to patient leaving prior to being seen by health care provider: Secondary | ICD-10-CM | POA: Insufficient documentation

## 2019-12-24 LAB — URINALYSIS, ROUTINE W REFLEX MICROSCOPIC
Bilirubin Urine: NEGATIVE
Glucose, UA: NEGATIVE mg/dL
Hgb urine dipstick: NEGATIVE
Ketones, ur: NEGATIVE mg/dL
Leukocytes,Ua: NEGATIVE
Nitrite: NEGATIVE
Protein, ur: NEGATIVE mg/dL
Specific Gravity, Urine: 1.023 (ref 1.005–1.030)
pH: 7 (ref 5.0–8.0)

## 2019-12-24 LAB — COMPREHENSIVE METABOLIC PANEL
ALT: 21 U/L (ref 0–44)
AST: 21 U/L (ref 15–41)
Albumin: 4.6 g/dL (ref 3.5–5.0)
Alkaline Phosphatase: 79 U/L (ref 38–126)
Anion gap: 10 (ref 5–15)
BUN: 9 mg/dL (ref 6–20)
CO2: 27 mmol/L (ref 22–32)
Calcium: 9.6 mg/dL (ref 8.9–10.3)
Chloride: 103 mmol/L (ref 98–111)
Creatinine, Ser: 1 mg/dL (ref 0.61–1.24)
GFR calc Af Amer: >60 mL/min (ref >60)
GFR calc non Af Amer: >60 mL/min (ref >60)
Glucose, Bld: 98 mg/dL (ref 70–99)
Potassium: 3.9 mmol/L (ref 3.5–5.1)
Sodium: 140 mmol/L (ref 135–145)
Total Bilirubin: 1.4 mg/dL — ABNORMAL HIGH (ref 0.3–1.2)
Total Protein: 7.1 g/dL (ref 6.5–8.1)

## 2019-12-24 LAB — CBC
HCT: 42 % (ref 39.0–52.0)
Hemoglobin: 14.4 g/dL (ref 13.0–17.0)
MCH: 31 pg (ref 26.0–34.0)
MCHC: 34.3 g/dL (ref 30.0–36.0)
MCV: 90.3 fL (ref 80.0–100.0)
Platelets: 287 10*3/uL (ref 150–400)
RBC: 4.65 MIL/uL (ref 4.22–5.81)
RDW: 11.5 % (ref 11.5–15.5)
WBC: 20.4 10*3/uL — ABNORMAL HIGH (ref 4.0–10.5)
nRBC: 0 % (ref 0.0–0.2)

## 2019-12-24 LAB — LIPASE, BLOOD: Lipase: 21 U/L (ref 11–51)

## 2019-12-24 MED ORDER — SODIUM CHLORIDE 0.9% FLUSH: 3.0000 mL | Freq: Once | INTRAVENOUS | Status: DC

## 2019-12-24 NOTE — ED Triage Notes (Signed)
Pt to ED with c/o mid upper abd pain onset this pm.  Pt denies any nausea or vomiting.  No diarrhea

## 2019-12-24 NOTE — ED Notes (Signed)
Pt name was called 4x for updated vital signs and no response.

## 2019-12-25 NOTE — ED Notes (Signed)
Pt was called for repeat v/s w/ no answer, is presumed to have left the facility and is off hospital grounds

## 2020-04-14 ENCOUNTER — Other Ambulatory Visit: Payer: Self-pay

## 2020-04-14 ENCOUNTER — Emergency Department (HOSPITAL_COMMUNITY): Payer: Self-pay

## 2020-04-14 ENCOUNTER — Encounter (HOSPITAL_COMMUNITY): Payer: Self-pay | Admitting: Emergency Medicine

## 2020-04-14 ENCOUNTER — Emergency Department (HOSPITAL_COMMUNITY)
Admission: EM | Admit: 2020-04-14 | Discharge: 2020-04-14 | Disposition: A | Discharge status: Home / Self Care | Payer: Self-pay | Attending: Emergency Medicine | Admitting: Emergency Medicine

## 2020-04-14 DIAGNOSIS — K047 Periapical abscess without sinus: Secondary | ICD-10-CM | POA: Insufficient documentation

## 2020-04-14 DIAGNOSIS — F17210 Nicotine dependence, cigarettes, uncomplicated: Secondary | ICD-10-CM | POA: Insufficient documentation

## 2020-04-14 LAB — CBC
HCT: 44.6 % (ref 39.0–52.0)
Hemoglobin: 14.7 g/dL (ref 13.0–17.0)
MCH: 30.1 pg (ref 26.0–34.0)
MCHC: 33 g/dL (ref 30.0–36.0)
MCV: 91.4 fL (ref 80.0–100.0)
Platelets: 264 10*3/uL (ref 150–400)
RBC: 4.88 MIL/uL (ref 4.22–5.81)
RDW: 11.2 % — ABNORMAL LOW (ref 11.5–15.5)
WBC: 10.4 10*3/uL (ref 4.0–10.5)
nRBC: 0 % (ref 0.0–0.2)

## 2020-04-14 MED ORDER — HYDROCODONE-ACETAMINOPHEN 5-325 MG PO TABS: 1.0000 | ORAL_TABLET | Freq: Four times a day (QID) | ORAL | 0 refills | Status: AC | PRN

## 2020-04-14 MED ORDER — AMOXICILLIN-POT CLAVULANATE 875-125 MG PO TABS: 1.0000 | ORAL_TABLET | Freq: Two times a day (BID) | ORAL | 0 refills | Status: AC

## 2020-04-14 MED ORDER — IOHEXOL 300 MG/ML  SOLN
75.0000 mL | Freq: Once | INTRAMUSCULAR | Status: AC | PRN
  Administered 2020-04-14: 12:00:00 75 mL via INTRAVENOUS

## 2020-04-14 MED ORDER — FENTANYL CITRATE (PF) 100 MCG/2ML IJ SOLN
50.0000 ug | Freq: Once | INTRAMUSCULAR | Status: AC
  Administered 2020-04-14: 10:00:00 50 ug via INTRAVENOUS
  Filled 2020-04-14: qty 2

## 2020-04-14 MED ORDER — SODIUM CHLORIDE 0.9 % IV BOLUS
500.0000 mL | Freq: Once | INTRAVENOUS | Status: AC
  Administered 2020-04-14: 500 mL via INTRAVENOUS

## 2020-04-14 NOTE — ED Notes (Signed)
CT

## 2020-04-14 NOTE — ED Triage Notes (Signed)
Pt states he broke his jaw last year, had multiple surgeries, last being in November, reports swelling to L side of jaw with pain that began a few hours ago. Also states his teeth are hurting as well.

## 2020-04-14 NOTE — ED Notes (Signed)
Discharge vitals in and IV removed. 

## 2020-04-14 NOTE — ED Provider Notes (Signed)
[facility] EMERGENCY DEPARTMENT Provider Note   CSN: [id number] Arrival date & time: 04/14/20  9470     History Chief Complaint  Patient presents with  . Jaw Pain    Christopher Mccall is a 24 y.o. male.  HPI Patient presents with left jaw pain and swelling.  Back in November had open fracture of the jaw required surgical repair at [facility].  States has had some pain since fusion controlled.  States there is bad teeth in the left lower jaw.  Has been hurting him some but as of the last day or 2 much more painful.  Now with swelling.  No difficulty breathing.  States it does hurt to eat and has been a little dehydrated eating less today.  No fevers.  Not on antibiotics.    History reviewed. No pertinent past medical history.  There are no problems to display for this patient.   Past Surgical History:  Procedure Laterality Date  . MANDIBLE FRACTURE SURGERY         No family history on file.  Social History   Tobacco Use  . Smoking status: Current Every Day Smoker    Types: Cigarettes  . Smokeless tobacco: Current User  Substance Use Topics  . Alcohol use: No  . Drug use: No    Home Medications Prior to Admission medications   Medication Sig Start Date End Date Taking? Authorizing Provider  acetaminophen (TYLENOL) 500 MG tablet Take 1,500 mg by mouth every 6 (six) hours as needed for moderate pain.   Yes [provider]  Ca Carbonate-Mag Hydroxide (ROLAIDS PO) Take 1 tablet by mouth as needed (indigestion).   Yes [provider]  ibuprofen (ADVIL) 600 MG tablet Take 1,800 mg by mouth every 6 (six) hours as needed for moderate pain.   Yes [provider]  amoxicillin-clavulanate (AUGMENTIN) 875-125 MG tablet Take 1 tablet by mouth every 12 (twelve) hours. 04/14/20   Benjiman Core, MD  HYDROcodone-acetaminophen (NORCO/VICODIN) 5-325 MG tablet Take 1-2 tablets by mouth every 6 (six) hours as needed. 04/14/20   Benjiman Core, MD    methocarbamol (ROBAXIN) 750 MG tablet Take 1 tablet (750 mg total) by mouth 4 (four) times daily as needed for muscle spasms (Take 1 tablet every 6 hours as needed for muscle spasms.). Patient not taking: Reported on 10/18/2016 03/07/14   Muthersbaugh, Dahlia Client, PA-C    Allergies    Patient has no known allergies.  Review of Systems   Review of Systems  Constitutional: Negative for appetite change and fever.  HENT: Negative for trouble swallowing.        Left jaw pain and swelling.  Respiratory: Negative for shortness of breath.   Cardiovascular: Negative for chest pain.  Gastrointestinal: Negative for abdominal pain.  Musculoskeletal: Negative for back pain.  Skin: Negative for rash.  Neurological: Negative for weakness.    Physical Exam Updated Vital Signs BP (!) 138/101   Pulse 99   Temp 98.7 F (37.1 C) (Oral)   Resp 12   Ht 5\' 11"  (1.803 m)   Wt 74.8 kg   SpO2 100%   BMI 23.01 kg/m   Physical Exam Vitals and nursing note reviewed.  HENT:     Mouth/Throat:     Comments: Tenderness and swelling to left lateral mandible.  Some trismus.  No swelling of floor mouth.  Poor dentition over mid left mandible. Cardiovascular:     Rate and Rhythm: Regular rhythm.  Pulmonary:     Breath  sounds: No wheezing or rhonchi.  Abdominal:     Tenderness: There is no abdominal tenderness.  Skin:    Capillary Refill: Capillary refill takes less than 2 seconds.  Neurological:     Mental Status: He is alert and oriented to person, place, and time.     ED Results / Procedures / Treatments   Labs (all labs ordered are listed, but only abnormal results are displayed) Labs Reviewed  CBC - Abnormal; Notable for the following components:      Result Value   RDW 11.2 (*)    All other components within normal limits    EKG None  Radiology CT Maxillofacial W Contrast  Result Date: 04/14/2020 CLINICAL DATA:  Left facial swelling today. History of multiple mandible fractures EXAM: CT  MAXILLOFACIAL WITH CONTRAST TECHNIQUE: Multidetector CT imaging of the maxillofacial structures was performed with intravenous contrast. Multiplanar CT image reconstructions were also generated. CONTRAST:  29mL OMNIPAQUE IOHEXOL 300 MG/ML  SOLN COMPARISON:  CT maxillofacial 06/18/2018 FINDINGS: Osseous: Left mandibular condyle fracture has healed and the condyle is in normal location. Previously noted angulated fracture of the right mandibular condyle has healed with angulated deformity. The condylar surface is flattened and subluxed medially and anteriorly to the condylar fossa. Previously noted mandibular symphysis fracture is healed without deformity. No acute fracture or bone destruction. Extensive dental infection with numerous caries. Periapical lucencies are seen around left lower molars. Orbits: Negative Sinuses: Mild mucosal edema paranasal sinuses.  No air-fluid level. Soft tissues: Soft tissue swelling lateral to the body of the mandible on the left. There is subcutaneous edema without abscess. Limited intracranial: Negative IMPRESSION: 1. Previously noted mandibular fractures have healed. The right mandibular condyle fracture has healed with angulation deformity. The right mandibular condyle is subluxed medially and anteriorly. 2. Poor dentition with multiple caries. Periapical lucencies around left lower molars. 3. There is soft tissue swelling lateral to the mandible on the left which may be related dental infection. No soft tissue abscess. Electronically Signed   By: Franchot Gallo M.D.   On: 04/14/2020 11:54    Procedures Procedures (including critical care time)  Medications Ordered in ED Medications  sodium chloride 0.9 % bolus 500 mL (0 mLs Intravenous Stopped 04/14/20 1113)  fentaNYL (SUBLIMAZE) injection 50 mcg (50 mcg Intravenous Given 04/14/20 1012)  iohexol (OMNIPAQUE) 300 MG/ML solution 75 mL (75 mLs Intravenous Contrast Given 04/14/20 1132)    ED Course  I have reviewed the triage  vital signs and the nursing notes.  Pertinent labs & imaging results that were available during my care of the patient were reviewed by me and considered in my medical decision making (see chart for details).    MDM Rules/Calculators/A&P                      Patient with left-sided jaw pain and swelling.  Has had previous jaw fracture.  Due to previous fracture CT scan done and showed swelling without abscess.  Will treat with antibiotics and oral surgery follow-up. Final Clinical Impression(s) / ED Diagnoses Final diagnoses:  Dental infection    Rx / DC Orders ED Discharge Orders         Ordered    amoxicillin-clavulanate (AUGMENTIN) 875-125 MG tablet  Every 12 hours     04/14/20 1226    HYDROcodone-acetaminophen (NORCO/VICODIN) 5-325 MG tablet  Every 6 hours PRN     04/14/20 1226           Davonna Belling,  MD 04/14/20 1608

## 2020-04-14 NOTE — Discharge Instructions (Signed)
You have an infection that appears to be coming off a tooth.  Follow-up with oral surgery.  Watch for worsening infection and swelling.  Take the antibiotics and pain medicines.

## 2020-04-14 NOTE — ED Notes (Signed)
Pt refused straight stick to get labs drawn. Pt stated "I don't like needles and im dehydrated" He is requesting something to eat and drink. RN aware. Explained to pt why he could not have anything to eat yet due to CT scan ordered.

## 2021-12-06 IMAGING — CT CT MAXILLOFACIAL W/ CM
3 series · 16 of 47 positions shown, 19 images · IV contrast (APPLIED)
Comparison: CT maxillofacial 06/18/2018

CLINICAL DATA: Left facial swelling today. History of multiple
mandible fractures

EXAM:
CT MAXILLOFACIAL WITH CONTRAST
TECHNIQUE: Multidetector CT imaging of the maxillofacial structures was
performed with intravenous contrast. Multiplanar CT image
reconstructions were also generated.
CONTRAST:  75mL OMNIPAQUE IOHEXOL 300 MG/ML  SOLN

[Series 3: facial/orbits w 2.0 st · axial · 0.39mm/px · z∈[+1308,+1458]mm · 10 of 87 slices shown, 13 images]
[im 6/87  brain]
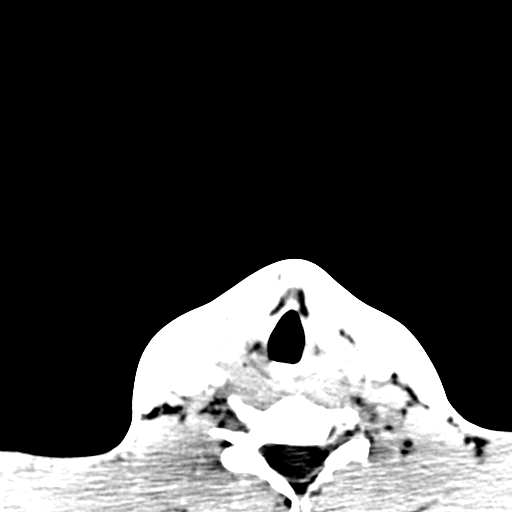
[im 6/87  bone]
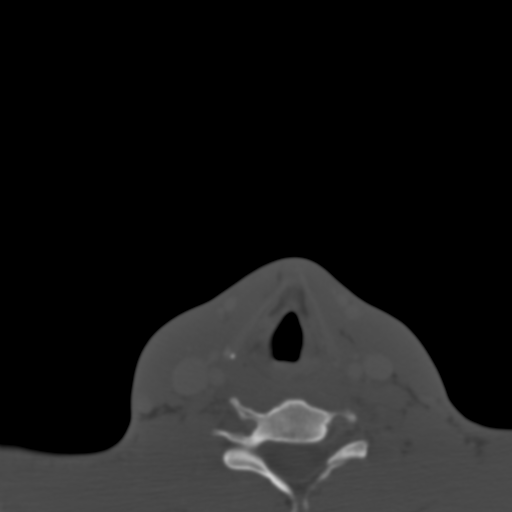
[im 15/87  bone]
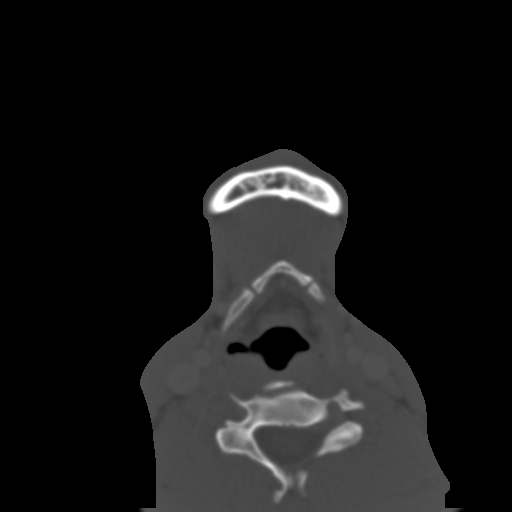
[im 24/87  bone]
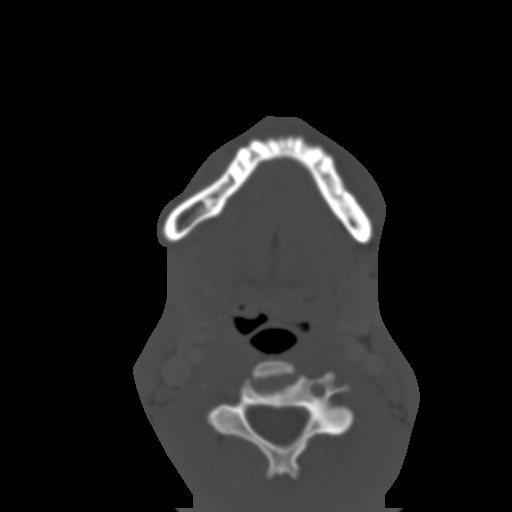
[im 30/87  bone]
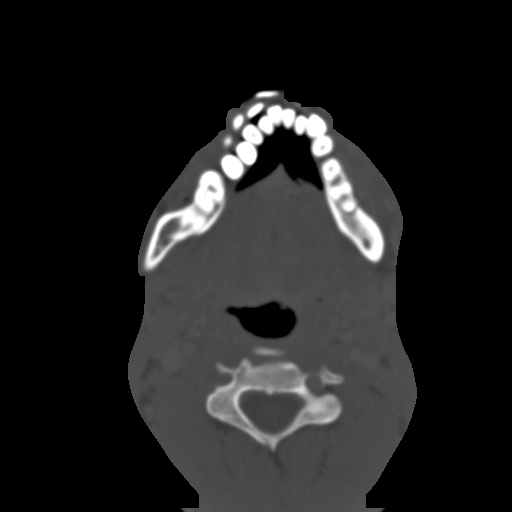
[im 39/87  brain]
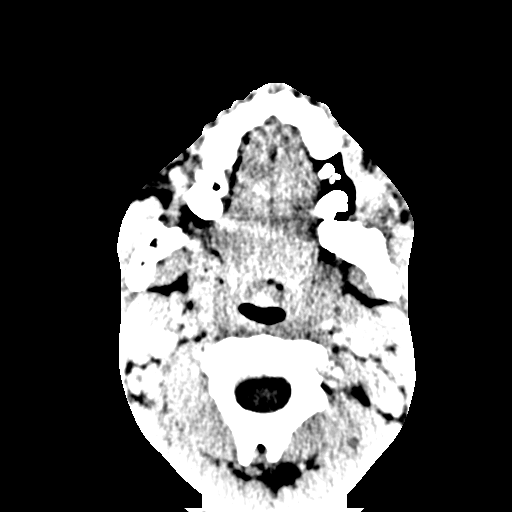
[im 39/87  bone]
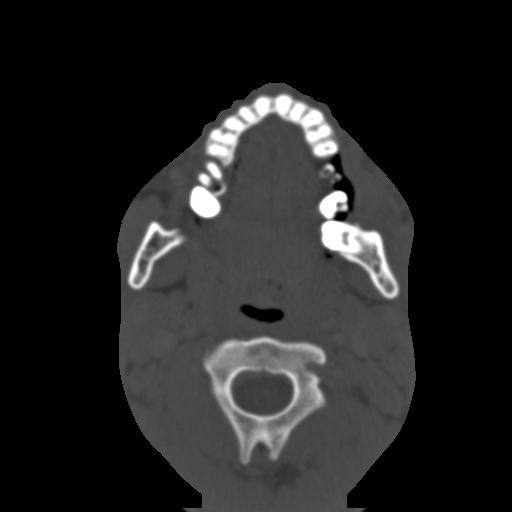
[im 48/87  bone]
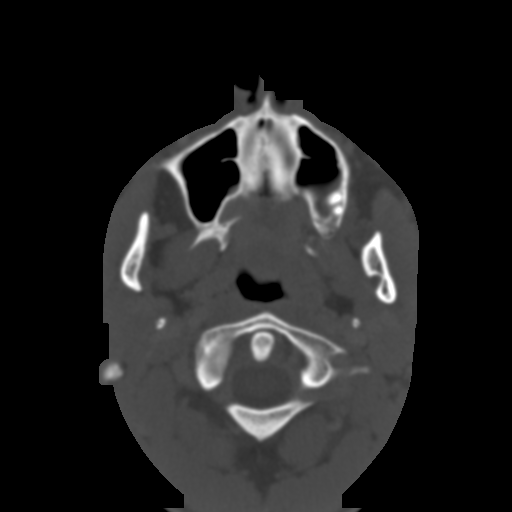
[im 57/87  bone]
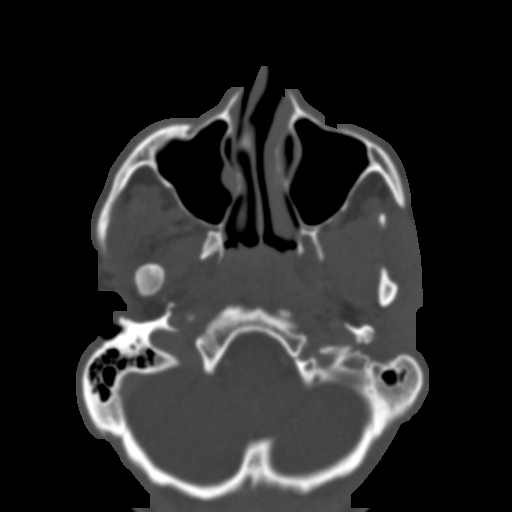
[im 66/87  bone]
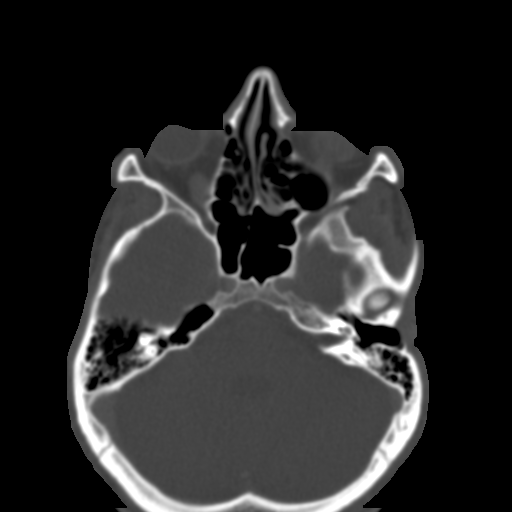
[im 72/87  brain]
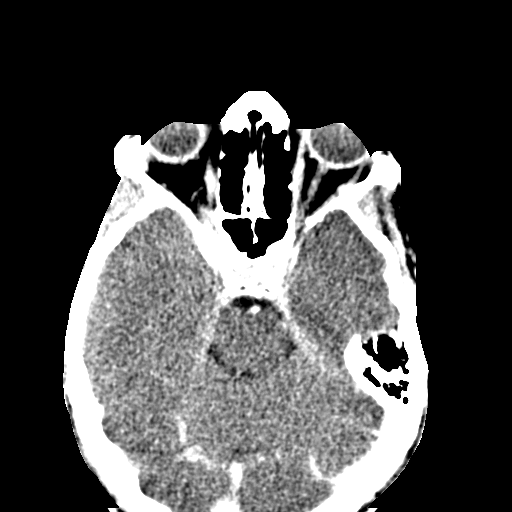
[im 72/87  bone]
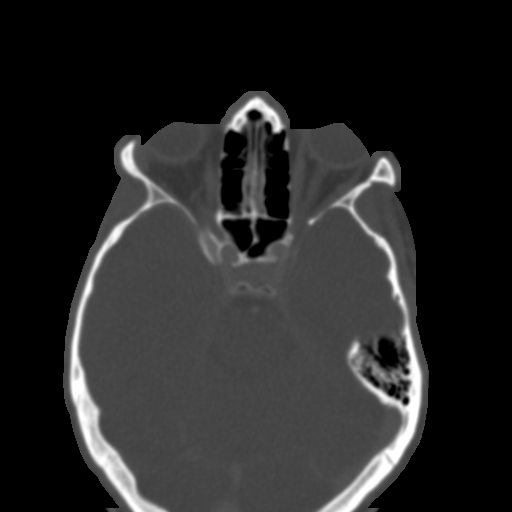
[im 81/87  bone]
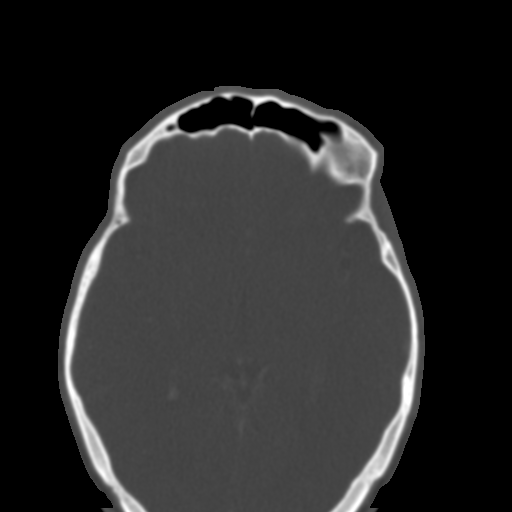

[Series 7: coronal soft tissue · coronal · 0.37mm/px · 3 of 84 slices shown]
[im 28/84  bone]
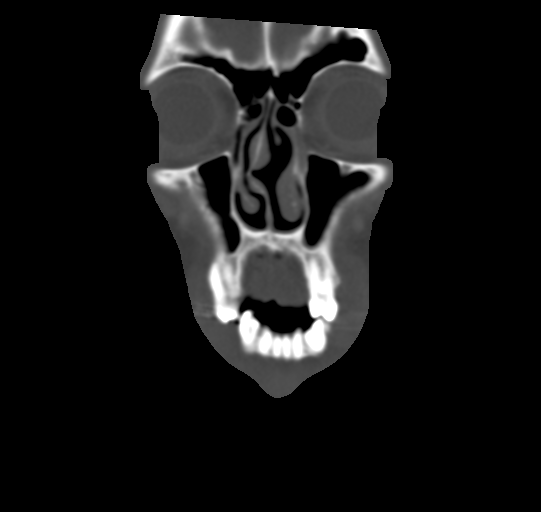
[im 37/84  bone]
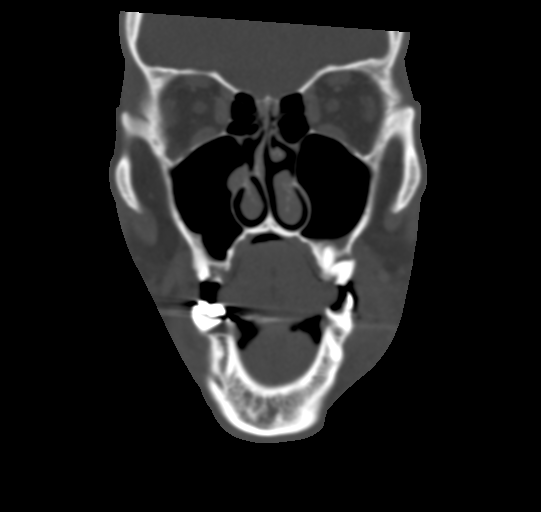
[im 47/84  bone]
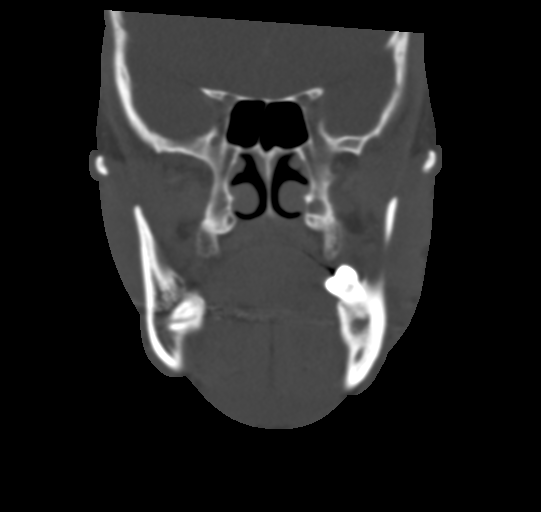

[Series 8: sagittal soft tissue · sagittal · 0.39mm/px · 3 of 95 slices shown]
[im 33/95  bone]
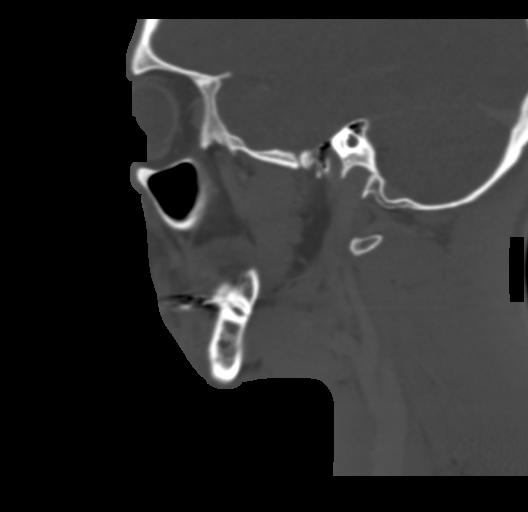
[im 48/95  bone]
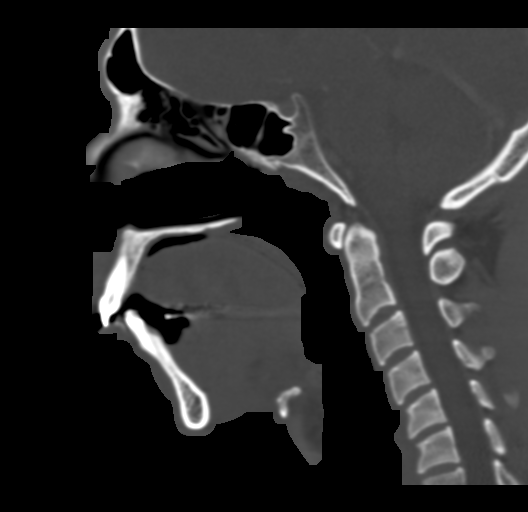
[im 62/95  bone]
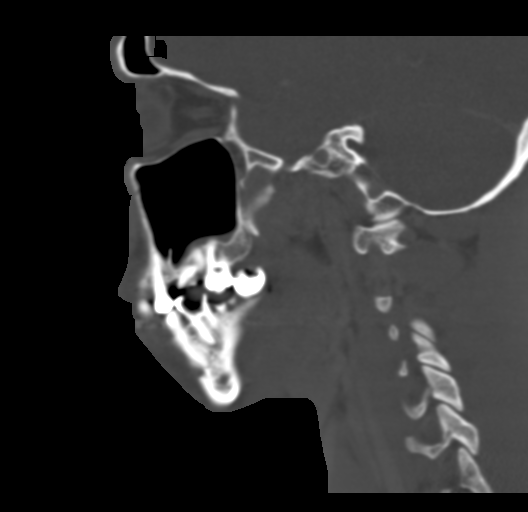

[16 of 47 positions shown; findings below may reference images not displayed]

FINDINGS: Osseous: Left mandibular condyle fracture has healed and the condyle
is in normal location.

Previously noted angulated fracture of the right mandibular condyle
has healed with angulated deformity. The condylar surface is
flattened and subluxed medially and anteriorly to the condylar
fossa.

Previously noted mandibular symphysis fracture is healed without
deformity.

No acute fracture or bone destruction.

Extensive dental infection with numerous caries. Periapical
lucencies are seen around left lower molars.

Orbits: Negative

Sinuses: Mild mucosal edema paranasal sinuses.  No air-fluid level.

Soft tissues: Soft tissue swelling lateral to the body of the
mandible on the left. There is subcutaneous edema without abscess.

Limited intracranial: Negative
IMPRESSION: 1. Previously noted mandibular fractures have healed. The right
mandibular condyle fracture has healed with angulation deformity.
The right mandibular condyle is subluxed medially and anteriorly.
2. Poor dentition with multiple caries. Periapical lucencies around
left lower molars.
3. There is soft tissue swelling lateral to the mandible on the left
which may be related dental infection. No soft tissue abscess.

## 2024-03-29 ENCOUNTER — Encounter (HOSPITAL_COMMUNITY): Payer: Self-pay

## 2024-03-29 ENCOUNTER — Ambulatory Visit (HOSPITAL_COMMUNITY)
Admission: EM | Admit: 2024-03-29 | Discharge: 2024-03-29 | Disposition: A | Discharge status: Home / Self Care | Attending: Physician Assistant | Admitting: Physician Assistant

## 2024-03-29 DIAGNOSIS — R6884 Jaw pain: Secondary | ICD-10-CM | POA: Diagnosis not present

## 2024-03-29 MED ORDER — PREDNISONE 20 MG PO TABS: 40.0000 mg | ORAL_TABLET | Freq: Every day | ORAL | 0 refills | Status: AC

## 2024-03-29 NOTE — ED Triage Notes (Signed)
 Pt states that he broke his jaw a few years ago. Pt states that he has some jaw pain that has gotten worse in x2-3 days.

## 2024-03-29 NOTE — ED Provider Notes (Signed)
 [facility]    CSN: [id number] Arrival date & time: 03/29/24  1225      History   Chief Complaint Chief Complaint  Patient presents with   Jaw Pain    HPI Christopher Mccall is a 28 y.o. male.   Patient here today for evaluation of diffuse mandibular pain that is been worsening over the last few days.  He reports that typically he will have some pain at baseline from prior fracture and repair however pain has been worsening.  He states that opening his mouth tends to worsen the pain.  He denies any fevers.  He has been taking ibuprofen  without resolution.  He denies any additional injury.  The history is provided by the patient.    History reviewed. No pertinent past medical history.  There are no active problems to display for this patient.   Past Surgical History:  Procedure Laterality Date   MANDIBLE FRACTURE SURGERY         Home Medications    Prior to Admission medications   Medication Sig Start Date End Date Taking? Authorizing Provider  ibuprofen  (ADVIL ) 600 MG tablet Take 1,800 mg by mouth every 6 (six) hours as needed for moderate pain.   Yes [provider]  predniSONE  (DELTASONE ) 20 MG tablet Take 2 tablets (40 mg total) by mouth daily with breakfast for 5 days. 03/29/24 04/03/24 Yes Vernestine Gondola, PA-C  acetaminophen  (TYLENOL ) 500 MG tablet Take 1,500 mg by mouth every 6 (six) hours as needed for moderate pain.    [provider]  amoxicillin -clavulanate (AUGMENTIN ) 875-125 MG tablet Take 1 tablet by mouth every 12 (twelve) hours. 04/14/20   Mozell Arias, MD  Ca Carbonate-Mag Hydroxide (ROLAIDS PO) Take 1 tablet by mouth as needed (indigestion).    [provider]  HYDROcodone -acetaminophen  (NORCO/VICODIN) 5-325 MG tablet Take 1-2 tablets by mouth every 6 (six) hours as needed. 04/14/20   Mozell Arias, MD  methocarbamol  (ROBAXIN ) 750 MG tablet Take 1 tablet (750 mg total) by mouth 4 (four) times daily as needed for  muscle spasms (Take 1 tablet every 6 hours as needed for muscle spasms.). Patient not taking: Reported on 10/18/2016 03/07/14   Muthersbaugh, Lilia Reges    Family History History reviewed. No pertinent family history.  Social History Social History   Tobacco Use   Smoking status: Every Day    Types: Cigarettes   Smokeless tobacco: Current  Vaping Use   Vaping status: Never Used  Substance Use Topics   Alcohol use: No   Drug use: No     Allergies   Patient has no known allergies.   Review of Systems Review of Systems  Constitutional:  Negative for chills and fever.  HENT:  Negative for facial swelling.        Positive for mandibular pain  Eyes:  Negative for discharge and redness.  Respiratory:  Negative for shortness of breath.   Skin:  Negative for color change and wound.  Neurological:  Negative for numbness.     Physical Exam Triage Vital Signs ED Triage Vitals  Encounter Vitals Group     BP 03/29/24 1244 133/89     Systolic BP Percentile --      Diastolic BP Percentile --      Pulse Rate 03/29/24 1244 78     Resp 03/29/24 1244 17     Temp 03/29/24 1244 (!) 97.5 F (36.4 C)     Temp Source 03/29/24 1244 Oral  SpO2 03/29/24 1244 99 %     Weight 03/29/24 1242 155 lb (70.3 kg)     Height 03/29/24 1242 5\' 11"  (1.803 m)     Head Circumference --      Peak Flow --      Pain Score 03/29/24 1242 10     Pain Loc --      Pain Education --      Exclude from Growth Chart --    No data found.  Updated Vital Signs BP 133/89 (BP Location: Right Arm)   Pulse 78   Temp (!) 97.5 F (36.4 C) (Oral)   Resp 17   Ht 5\' 11"  (1.803 m)   Wt 155 lb (70.3 kg)   SpO2 99%   BMI 21.62 kg/m   Visual Acuity Right Eye Distance:   Left Eye Distance:   Bilateral Distance:    Right Eye Near:   Left Eye Near:    Bilateral Near:     Physical Exam Vitals and nursing note reviewed.  Constitutional:      General: He is not in acute distress.    Appearance: Normal  appearance. He is not ill-appearing.  HENT:     Head: Normocephalic and atraumatic.     Mouth/Throat:     Comments: Mild TTP to mandible diffusely, no swelling/ erythema Eyes:     Conjunctiva/sclera: Conjunctivae normal.  Cardiovascular:     Rate and Rhythm: Normal rate.  Pulmonary:     Effort: Pulmonary effort is normal. No respiratory distress.  Neurological:     Mental Status: He is alert.  Psychiatric:        Mood and Affect: Mood normal.        Behavior: Behavior normal.        Thought Content: Thought content normal.      UC Treatments / Results  Labs (all labs ordered are listed, but only abnormal results are displayed) Labs Reviewed - No data to display  EKG   Radiology No results found.  Procedures Procedures (including critical care time)  Medications Ordered in UC Medications - No data to display  Initial Impression / Assessment and Plan / UC Course  I have reviewed the triage vital signs and the nursing notes.  Pertinent labs & imaging results that were available during my care of the patient were reviewed by me and considered in my medical decision making (see chart for details).    Suspect likely inflammatory cause of pain- will trial steroid burst and recommended further evaluation in the ED if symptoms fail to improve or worsen in any way,.   Final Clinical Impressions(s) / UC Diagnoses   Final diagnoses:  Mandibular pain   Discharge Instructions   None    ED Prescriptions     Medication Sig Dispense Auth. Provider   predniSONE  (DELTASONE ) 20 MG tablet Take 2 tablets (40 mg total) by mouth daily with breakfast for 5 days. 10 tablet Vernestine Gondola, PA-C      PDMP not reviewed this encounter.   Vernestine Gondola, PA-C 03/29/24 1330

## 2024-04-11 DIAGNOSIS — T8140XA Infection following a procedure, unspecified, initial encounter: Secondary | ICD-10-CM | POA: Diagnosis not present

## 2024-04-11 DIAGNOSIS — Z23 Encounter for immunization: Secondary | ICD-10-CM | POA: Diagnosis not present

## 2024-04-11 DIAGNOSIS — M7989 Other specified soft tissue disorders: Secondary | ICD-10-CM | POA: Diagnosis not present

## 2024-04-11 DIAGNOSIS — L03113 Cellulitis of right upper limb: Secondary | ICD-10-CM | POA: Diagnosis not present

## 2024-04-11 DIAGNOSIS — S61411A Laceration without foreign body of right hand, initial encounter: Secondary | ICD-10-CM | POA: Diagnosis not present

## 2024-04-11 DIAGNOSIS — L02511 Cutaneous abscess of right hand: Secondary | ICD-10-CM | POA: Diagnosis not present

## 2024-04-11 DIAGNOSIS — A419 Sepsis, unspecified organism: Secondary | ICD-10-CM | POA: Diagnosis not present

## 2024-04-11 DIAGNOSIS — L089 Local infection of the skin and subcutaneous tissue, unspecified: Secondary | ICD-10-CM | POA: Diagnosis not present

## 2024-04-11 DIAGNOSIS — L03119 Cellulitis of unspecified part of limb: Secondary | ICD-10-CM | POA: Diagnosis not present

## 2024-04-12 DIAGNOSIS — Z23 Encounter for immunization: Secondary | ICD-10-CM | POA: Diagnosis not present

## 2024-04-12 DIAGNOSIS — L03113 Cellulitis of right upper limb: Secondary | ICD-10-CM | POA: Diagnosis not present

## 2024-04-12 DIAGNOSIS — L02511 Cutaneous abscess of right hand: Secondary | ICD-10-CM | POA: Diagnosis not present

## 2024-04-12 DIAGNOSIS — M66341 Spontaneous rupture of flexor tendons, right hand: Secondary | ICD-10-CM | POA: Diagnosis not present

## 2024-04-12 DIAGNOSIS — A419 Sepsis, unspecified organism: Secondary | ICD-10-CM | POA: Diagnosis not present

## 2024-04-12 DIAGNOSIS — L03119 Cellulitis of unspecified part of limb: Secondary | ICD-10-CM | POA: Diagnosis not present

## 2024-04-13 DIAGNOSIS — F17210 Nicotine dependence, cigarettes, uncomplicated: Secondary | ICD-10-CM | POA: Diagnosis not present

## 2024-04-13 DIAGNOSIS — L02511 Cutaneous abscess of right hand: Secondary | ICD-10-CM | POA: Diagnosis not present

## 2024-04-13 DIAGNOSIS — L03119 Cellulitis of unspecified part of limb: Secondary | ICD-10-CM | POA: Diagnosis not present

## 2024-04-13 DIAGNOSIS — A4902 Methicillin resistant Staphylococcus aureus infection, unspecified site: Secondary | ICD-10-CM | POA: Diagnosis not present

## 2024-04-13 DIAGNOSIS — S66811A Strain of other specified muscles, fascia and tendons at wrist and hand level, right hand, initial encounter: Secondary | ICD-10-CM | POA: Diagnosis not present
